# Patient Record
Sex: Female | Born: 1960 | Race: White | Hispanic: No | Smoking: Never smoker
Health system: Southern US, Community
[De-identification: ages and names within clinical notes are randomized; demographics above are authoritative.]

---

## 1998-09-18 ENCOUNTER — Encounter: Admission: RE | Admit: 1998-09-18 | Discharge: 1998-12-17 | Payer: Self-pay | Admitting: Neurological Surgery

## 2000-09-27 ENCOUNTER — Encounter: Payer: Self-pay | Admitting: Neurological Surgery

## 2000-09-27 ENCOUNTER — Ambulatory Visit (HOSPITAL_COMMUNITY): Admission: RE | Admit: 2000-09-27 | Discharge: 2000-09-27 | Payer: Self-pay | Admitting: Neurological Surgery

## 2001-03-03 ENCOUNTER — Encounter: Admission: RE | Admit: 2001-03-03 | Discharge: 2001-03-03 | Payer: Self-pay | Admitting: Obstetrics and Gynecology

## 2001-03-03 ENCOUNTER — Encounter: Payer: Self-pay | Admitting: Obstetrics and Gynecology

## 2001-05-24 ENCOUNTER — Encounter: Admission: RE | Admit: 2001-05-24 | Discharge: 2001-06-16 | Payer: Self-pay | Admitting: Orthopaedic Surgery

## 2001-06-17 ENCOUNTER — Encounter: Admission: RE | Admit: 2001-06-17 | Discharge: 2001-06-30 | Payer: Self-pay | Admitting: Orthopaedic Surgery

## 2001-12-08 ENCOUNTER — Encounter: Admission: RE | Admit: 2001-12-08 | Discharge: 2001-12-08 | Payer: Self-pay | Admitting: Obstetrics and Gynecology

## 2001-12-08 ENCOUNTER — Encounter: Payer: Self-pay | Admitting: Obstetrics and Gynecology

## 2002-06-22 ENCOUNTER — Ambulatory Visit (HOSPITAL_COMMUNITY): Admission: RE | Admit: 2002-06-22 | Discharge: 2002-06-22 | Payer: Self-pay | Admitting: Obstetrics and Gynecology

## 2002-06-22 ENCOUNTER — Encounter: Payer: Self-pay | Admitting: Obstetrics and Gynecology

## 2003-06-27 ENCOUNTER — Encounter: Payer: Self-pay | Admitting: Obstetrics and Gynecology

## 2003-06-27 ENCOUNTER — Ambulatory Visit (HOSPITAL_COMMUNITY): Admission: RE | Admit: 2003-06-27 | Discharge: 2003-06-27 | Payer: Self-pay | Admitting: Obstetrics and Gynecology

## 2003-12-06 ENCOUNTER — Encounter: Admission: RE | Admit: 2003-12-06 | Discharge: 2003-12-06 | Payer: Self-pay | Admitting: Neurological Surgery

## 2004-07-04 ENCOUNTER — Ambulatory Visit (HOSPITAL_COMMUNITY): Admission: RE | Admit: 2004-07-04 | Discharge: 2004-07-04 | Payer: Self-pay | Admitting: Obstetrics and Gynecology

## 2004-08-28 ENCOUNTER — Encounter: Admission: RE | Admit: 2004-08-28 | Discharge: 2004-09-27 | Payer: Self-pay | Admitting: Neurological Surgery

## 2005-07-10 ENCOUNTER — Ambulatory Visit (HOSPITAL_COMMUNITY): Admission: RE | Admit: 2005-07-10 | Discharge: 2005-07-10 | Payer: Self-pay | Admitting: Obstetrics and Gynecology

## 2006-01-26 ENCOUNTER — Ambulatory Visit (HOSPITAL_COMMUNITY): Admission: RE | Admit: 2006-01-26 | Discharge: 2006-01-26 | Payer: Self-pay | Admitting: Neurological Surgery

## 2006-08-13 ENCOUNTER — Ambulatory Visit (HOSPITAL_COMMUNITY): Admission: RE | Admit: 2006-08-13 | Discharge: 2006-08-13 | Payer: Self-pay | Admitting: Obstetrics and Gynecology

## 2007-09-24 ENCOUNTER — Ambulatory Visit (HOSPITAL_COMMUNITY): Admission: RE | Admit: 2007-09-24 | Discharge: 2007-09-24 | Payer: Self-pay | Admitting: Obstetrics and Gynecology

## 2008-09-25 ENCOUNTER — Ambulatory Visit (HOSPITAL_COMMUNITY): Admission: RE | Admit: 2008-09-25 | Discharge: 2008-09-25 | Payer: Self-pay | Admitting: Gynecology

## 2009-09-26 ENCOUNTER — Ambulatory Visit (HOSPITAL_COMMUNITY): Admission: RE | Admit: 2009-09-26 | Discharge: 2009-09-26 | Payer: Self-pay | Admitting: Gynecology

## 2010-09-30 ENCOUNTER — Ambulatory Visit (HOSPITAL_COMMUNITY): Admission: RE | Admit: 2010-09-30 | Discharge: 2010-09-30 | Payer: Self-pay | Admitting: Gynecology

## 2010-12-07 ENCOUNTER — Encounter: Payer: Self-pay | Admitting: Neurological Surgery

## 2012-07-05 ENCOUNTER — Other Ambulatory Visit (HOSPITAL_COMMUNITY): Payer: Self-pay | Admitting: Gynecology

## 2012-07-05 DIAGNOSIS — Z1231 Encounter for screening mammogram for malignant neoplasm of breast: Secondary | ICD-10-CM

## 2012-07-09 ENCOUNTER — Ambulatory Visit (HOSPITAL_COMMUNITY)
Admission: RE | Admit: 2012-07-09 | Discharge: 2012-07-09 | Disposition: A | Discharge status: Home / Self Care | Payer: BC Managed Care – PPO | Source: Ambulatory Visit | Attending: Gynecology | Admitting: Gynecology

## 2012-07-09 DIAGNOSIS — Z1231 Encounter for screening mammogram for malignant neoplasm of breast: Secondary | ICD-10-CM | POA: Insufficient documentation

## 2013-03-30 ENCOUNTER — Other Ambulatory Visit: Payer: Self-pay | Admitting: Obstetrics and Gynecology

## 2013-03-30 DIAGNOSIS — Z780 Asymptomatic menopausal state: Secondary | ICD-10-CM

## 2013-04-07 ENCOUNTER — Ambulatory Visit
Admission: RE | Admit: 2013-04-07 | Discharge: 2013-04-07 | Disposition: A | Discharge status: Home / Self Care | Payer: BC Managed Care – PPO | Source: Ambulatory Visit | Attending: Obstetrics and Gynecology | Admitting: Obstetrics and Gynecology

## 2013-04-07 DIAGNOSIS — Z780 Asymptomatic menopausal state: Secondary | ICD-10-CM

## 2014-09-11 ENCOUNTER — Encounter: Payer: Self-pay | Admitting: Cardiovascular Disease

## 2014-09-11 ENCOUNTER — Ambulatory Visit (INDEPENDENT_AMBULATORY_CARE_PROVIDER_SITE_OTHER): Payer: BC Managed Care – PPO | Admitting: Cardiovascular Disease

## 2014-09-11 VITALS — BP 128/94 | HR 72 | Ht 64.0 in | Wt 154.1 lb

## 2014-09-11 DIAGNOSIS — I471 Supraventricular tachycardia: Secondary | ICD-10-CM

## 2014-09-11 NOTE — Patient Instructions (Addendum)
I think you  have supra-ventricular tachycardia. Tried using the Valsalva maneuver or the diving reflex to low your  heart rate. Try Propranolol 10 mg if the episodes last for longer than 10 minutes.   Come to the ER or our office  if the episodes last for > 1 hour.   Your physician recommends that you continue on your current medications as directed. Please refer to the Current Medication list given to you today.  Your physician has recommended that you wear an event monitor. Event monitors are medical devices that record the heart's electrical activity. Doctors most often us these monitors to diagnose arrhythmias. Arrhythmias are problems with the speed or rhythm of the heartbeat. The monitor is a small, portable device. You can wear one while you do your normal daily activities. This is usually used to diagnose what is causing palpitations/syncope (passing out).  Your physician recommends that you schedule a follow-up appointment in: 4-6 weeks with Dr. Elease HashimotoNahser.

## 2014-09-11 NOTE — Progress Notes (Signed)
     Ariel Hughes Date of Birth  01/18/1961       [facility]    [place] [facility] [address]  [address] [place], [place]  [address]   [place], [place]  [phone]     [phone]   Fax  [phone]     Fax [phone]  Problem List: 1. Palpitations 2.  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  Has had palpitations,  She feels these HR irregularities.   She notes heart racing -   She had an episode recently while swimming.  She got out of the pool and felt fatigues.  Her HR was much faster than she would have anticipated at that time in her work out.  The episode lasted for 5-10 minutes .  She stopped drinking coffee 3 months ago.  Only rare sodas.    Current Outpatient Prescriptions  Medication Sig Dispense Refill  . aspirin EC 81 MG tablet Take 81 mg by mouth daily.       Marland Kitchen. estradiol (MINIVELLE) 0.075 MG/24HR Place 1 patch onto the skin 2 (two) times a week.       . progesterone (PROMETRIUM) 200 MG capsule Take 200 mg by mouth daily. For 12 days       No current facility-administered medications for this visit.     No Known Allergies  No past medical history on file.  No past surgical history on file.  History  Smoking status  . Never Smoker   Smokeless tobacco  . Not on file    History  Alcohol Use: Not on file    No family history on file.  Reviw of Systems:  Reviewed in the HPI.  All other systems are negative.  Physical Exam: Blood pressure 128/94, pulse 72, height 5\' 4"  (1.626 m), weight 154 lb 1.9 oz (69.908 kg). Wt Readings from Last 3 Encounters:  09/11/14 154 lb 1.9 oz (69.908 kg)     General: Well developed, well nourished, in no acute distress.  Head: Normocephalic, atraumatic, sclera non-icteric, mucus membranes are moist,   Neck: Supple. Carotids are 2 + without bruits. No JVD   Lungs: Clear   Heart: RR, normal s1s2  Abdomen: Soft, non-tender,  non-distended with normal bowel sounds.  Msk:  Strength and tone are normal   Extremities: No clubbing or cyanosis. No edema.  Distal pedal pulses are 2+ and equal    Neuro: CN II - XII intact.  Alert and oriented X 3.   Psych:  Normal   ECG: Oct. 26, 2015:  NSR at 72  Assessment / Plan:

## 2014-09-11 NOTE — Assessment & Plan Note (Signed)
Ariel Hughes presents today with symptoms consistent with supraventricular tachycardia. It's possible that she is having brief episodes of atrial fibrillation but I think that the episodes are most likely SVT.  We've reviewed the Valsalva reflex and diving reflex. We discussed different medical therapies for treatment including short-term propranolol and longer-term metoprolol.  We also discussed RF ablation / modification of the AV node for SVT.  We'll place 30 day event monitor on her for further diagnosis. 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 I do not think that she needs an echocardiogram. I'm fairly  sure that she has normal LV function.  If she is found to have something other than SVT, then we will likely need an echo.   I will see her in 4-6 weeks.

## 2014-09-13 ENCOUNTER — Encounter: Payer: Self-pay | Admitting: *Deleted

## 2014-09-13 NOTE — Progress Notes (Signed)
Patient ID: Ariel Hughes, female   DOB: 02/22/1961, 53 y.o.   MRN: [id number] Patient did not show up for 09/13/2014 9:30AM appointment to have a 30 day cardiac event monitor applied.

## 2014-09-18 ENCOUNTER — Other Ambulatory Visit: Payer: Self-pay | Admitting: Nurse Practitioner

## 2014-09-18 ENCOUNTER — Encounter (INDEPENDENT_AMBULATORY_CARE_PROVIDER_SITE_OTHER): Payer: BC Managed Care – PPO

## 2014-09-18 ENCOUNTER — Encounter: Payer: Self-pay | Admitting: *Deleted

## 2014-09-18 DIAGNOSIS — I471 Supraventricular tachycardia: Secondary | ICD-10-CM

## 2014-09-18 DIAGNOSIS — R002 Palpitations: Secondary | ICD-10-CM

## 2014-09-18 NOTE — Progress Notes (Signed)
Patient ID: Ariel Hughes, female   DOB: 08/15/1961, 53 y.o.   MRN: [id number] [employer] verite 30 day cardiac event monitor applied to patient.

## 2014-10-24 ENCOUNTER — Encounter: Payer: Self-pay | Admitting: Cardiovascular Disease

## 2014-10-24 ENCOUNTER — Ambulatory Visit (INDEPENDENT_AMBULATORY_CARE_PROVIDER_SITE_OTHER): Payer: BC Managed Care – PPO | Admitting: Cardiovascular Disease

## 2014-10-24 VITALS — BP 136/92 | HR 65 | Ht 64.0 in | Wt 154.0 lb

## 2014-10-24 DIAGNOSIS — I471 Supraventricular tachycardia: Secondary | ICD-10-CM

## 2014-10-24 NOTE — Assessment & Plan Note (Signed)
Ariel Hughes is seen back today for evaluation of her tachycardia. She had another puncture that showed only episodes of sinus tachycardia she never had any episodes of SVT. She has been instructed in Valsalva maneuver and the diving reflex.  At this point we'll see her on an as-needed basis. She has propranolol to take on an as-needed basis.

## 2014-10-24 NOTE — Progress Notes (Signed)
     Ariel Hughes Date of Birth  12/31/1960       [facility]    [facility] [place] [facility] [address]  [address] [place], [place]  [address]   [place], [place]  [address] [phone]     [phone]   Fax  [phone]     Fax [phone]  Problem List: 1. Palpitations 2.  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  Has had palpitations,  She feels these HR irregularities.   She notes heart racing -   She had an episode recently while swimming.  She got out of the pool and felt fatigues.  Her HR was much faster than she would have anticipated at that time in her work out.  The episode lasted for 5-10 minutes .  She stopped drinking coffee 3 months ago.  Only rare sodas.    Dec. 8, 2015  Ariel Hughes is doing ok Event monitor showed sinus tachy . Palpitations seem to occur more at night.   Current Outpatient Prescriptions  Medication Sig Dispense Refill  . aspirin EC 81 MG tablet Take 81 mg by mouth daily.     Marland Kitchen. estradiol (MINIVELLE) 0.075 MG/24HR Place 1 patch onto the skin 2 (two) times a week.     . progesterone (PROMETRIUM) 200 MG capsule Take 200 mg by mouth daily. For 12 days     No current facility-administered medications for this visit.     No Known Allergies  No past medical history on file.  No past surgical history on file.  History  Smoking status  . Never Smoker   Smokeless tobacco  . Not on file    History  Alcohol Use: Not on file    No family history on file.  Reviw of Systems:  Reviewed in the HPI.  All other systems are negative.  Physical Exam: Blood pressure 136/92, pulse 65, height 5\' 4"  (1.626 m), weight 154 lb (69.854 kg). Wt Readings from Last 3 Encounters:  10/24/14 154 lb (69.854 kg)  09/11/14 154 lb 1.9 oz (69.908 kg)     General: Well developed, well nourished, in no acute distress.  Head: Normocephalic, atraumatic, sclera non-icteric, mucus membranes are  moist,   Neck: Supple. Carotids are 2 + without bruits. No JVD   Lungs: Clear   Heart: RR, normal s1s2  Abdomen: Soft, non-tender, non-distended with normal bowel sounds.  Msk:  Strength and tone are normal   Extremities: No clubbing or cyanosis. No edema.  Distal pedal pulses are 2+ and equal    Neuro: CN II - XII intact.  Alert and oriented X 3.   Psych:  Normal   ECG: Oct. 26, 2015:  NSR at 72  Assessment / Plan:

## 2014-10-24 NOTE — Patient Instructions (Signed)
Your physician recommends that you continue on your current medications as directed. Please refer to the Current Medication list given to you today.  Your physician recommends that you schedule a follow-up appointment in: as needed with Dr. Nahser  

## 2019-12-29 ENCOUNTER — Ambulatory Visit: Payer: Self-pay | Attending: Internal Medicine

## 2019-12-29 ENCOUNTER — Ambulatory Visit: Payer: Self-pay

## 2019-12-29 DIAGNOSIS — Z23 Encounter for immunization: Secondary | ICD-10-CM | POA: Insufficient documentation

## 2019-12-29 NOTE — Progress Notes (Signed)
   Covid-19 Vaccination Clinic  Name:  Ariel Hughes    MRN: [id number] DOB: 12-02-1960  12/29/2019  Ariel Hughes was observed post Covid-19 immunization for 15 minutes without incidence. She was provided with Vaccine Information Sheet and instruction to access the V-Safe system.   Ariel Hughes was instructed to call 911 with any severe reactions post vaccine: Marland Kitchen Difficulty breathing  . Swelling of your face and throat  . A fast heartbeat  . A bad rash all over your body  . Dizziness and weakness    Immunizations Administered    Name Date Dose VIS Date Route   Pfizer COVID-19 Vaccine 12/29/2019 10:49 AM 0.3 mL 10/28/2019 Intramuscular   Manufacturer: [employer], [employer]   Lot: [id number]   NDC: [address]-1

## 2020-01-23 ENCOUNTER — Ambulatory Visit: Payer: Self-pay | Attending: Internal Medicine

## 2020-01-23 DIAGNOSIS — Z23 Encounter for immunization: Secondary | ICD-10-CM

## 2020-01-23 NOTE — Progress Notes (Signed)
   Covid-19 Vaccination Clinic  Name:  Ariel Hughes    MRN: [id number] DOB: Oct 01, 1961  01/23/2020  Ariel Hughes was observed post Covid-19 immunization for 15 minutes without incident. She was provided with Vaccine Information Sheet and instruction to access the V-Safe system.   Ariel Hughes was instructed to call 911 with any severe reactions post vaccine: Marland Kitchen Difficulty breathing  . Swelling of face and throat  . A fast heartbeat  . A bad rash all over body  . Dizziness and weakness   Immunizations Administered    Name Date Dose VIS Date Route   Pfizer COVID-19 Vaccine 01/23/2020  8:25 AM 0.3 mL 10/28/2019 Intramuscular   Manufacturer: [employer], [employer]   Lot: [id number]   NDC: [address]-1

## 2020-08-23 ENCOUNTER — Other Ambulatory Visit: Payer: Self-pay | Admitting: Family Medicine

## 2020-08-23 DIAGNOSIS — M4722 Other spondylosis with radiculopathy, cervical region: Secondary | ICD-10-CM

## 2020-09-14 ENCOUNTER — Other Ambulatory Visit: Payer: Self-pay

## 2020-09-20 ENCOUNTER — Ambulatory Visit
Admission: RE | Admit: 2020-09-20 | Discharge: 2020-09-20 | Disposition: A | Discharge status: Home / Self Care | Payer: BC Managed Care – PPO | Source: Ambulatory Visit | Attending: Family Medicine | Admitting: Family Medicine

## 2020-09-20 ENCOUNTER — Other Ambulatory Visit: Payer: Self-pay

## 2020-09-20 DIAGNOSIS — M4722 Other spondylosis with radiculopathy, cervical region: Secondary | ICD-10-CM

## 2021-03-15 IMAGING — MR MR CERVICAL SPINE W/O CM
4 of 5 series · 28 of 48 positions shown · non-contrast
Comparison: MRI of the cervical spine December 06, 2003

CLINICAL DATA: Cervical spondylosis with radiculopathy.

EXAM:
MRI CERVICAL SPINE WITHOUT CONTRAST
TECHNIQUE: Multiplanar, multisequence MR imaging of the cervical spine was
performed. No intravenous contrast was administered.

[Series 3: T2 · sagittal · 3.0mm · 0.66mm/px · 6 of 14 slices shown (1 of 2)]
[im 1/14]
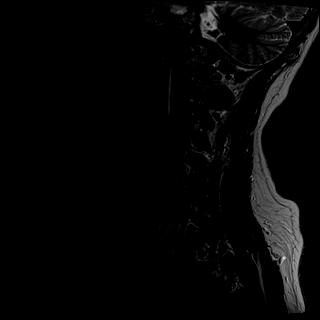
[im 3/14]
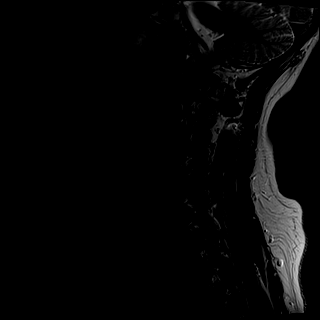
[im 6/14]
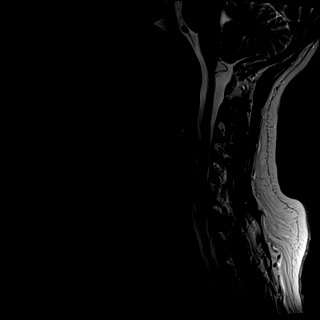
[im 8/14]
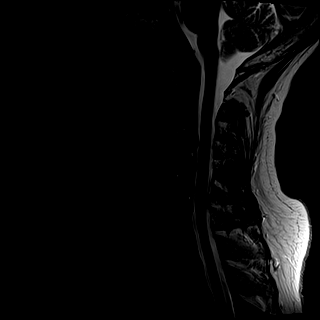
[im 11/14]
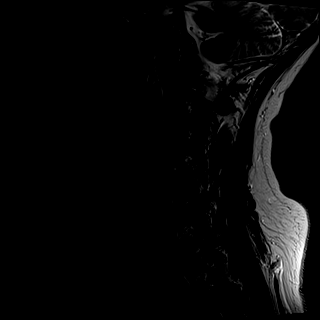
[im 14/14]
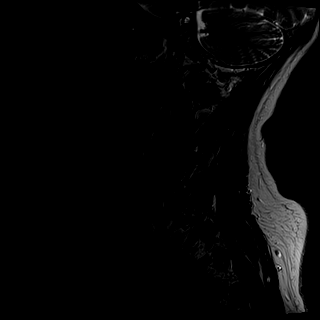

[Series 4: T1 · sagittal · 3.0mm · 0.41mm/px · 7 of 14 slices shown]
[im 1/14]
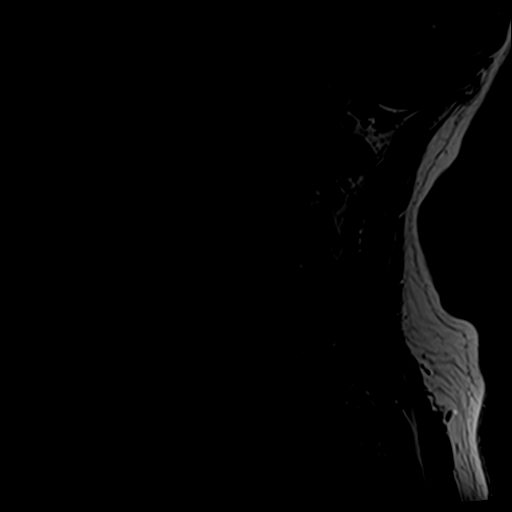
[im 3/14]
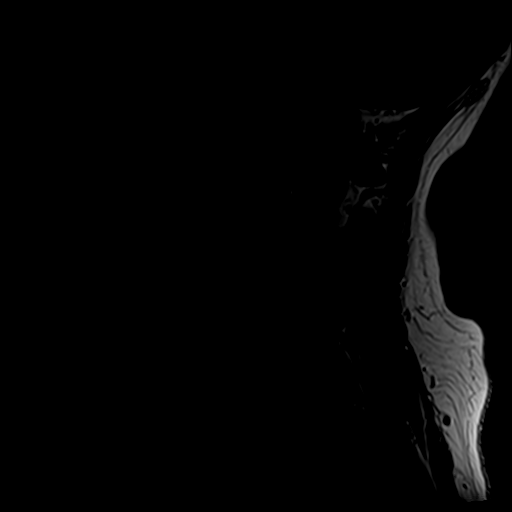
[im 5/14]
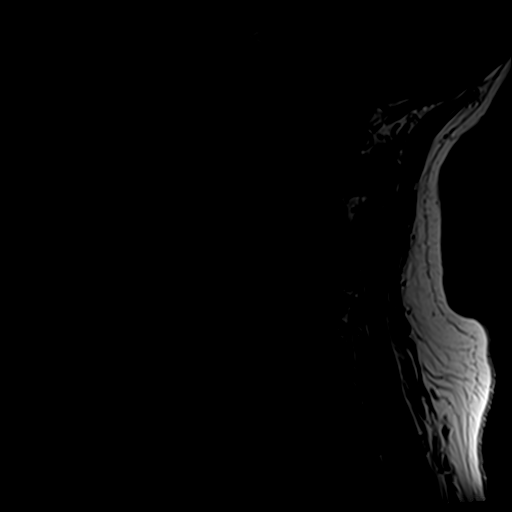
[im 7/14]
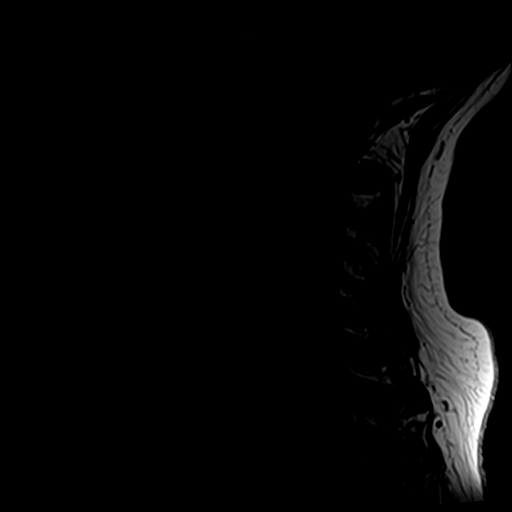
[im 9/14]
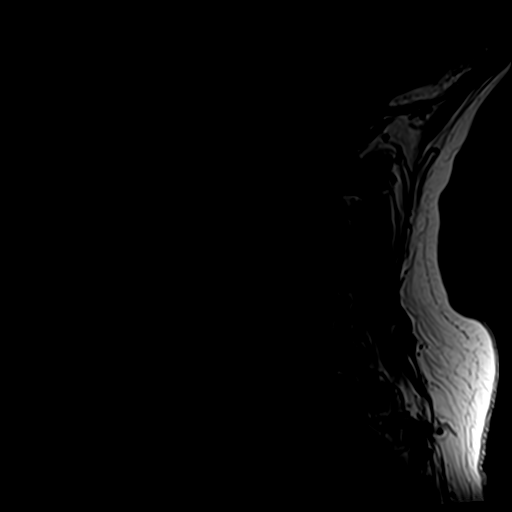
[im 11/14]
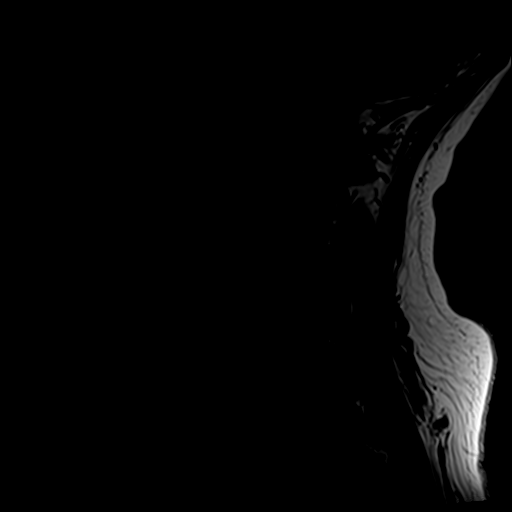
[im 14/14]
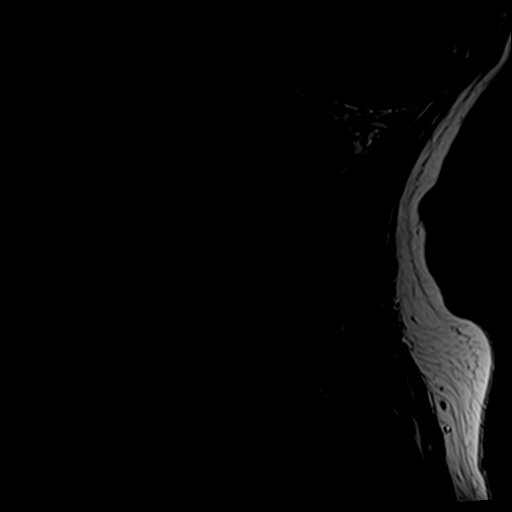

[Series 5: tir sag · sagittal · 3.0mm · 0.41mm/px · 7 of 14 slices shown]
[im 1/14]
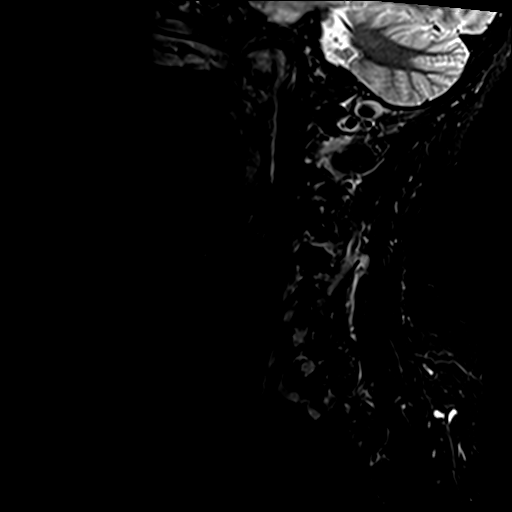
[im 3/14]
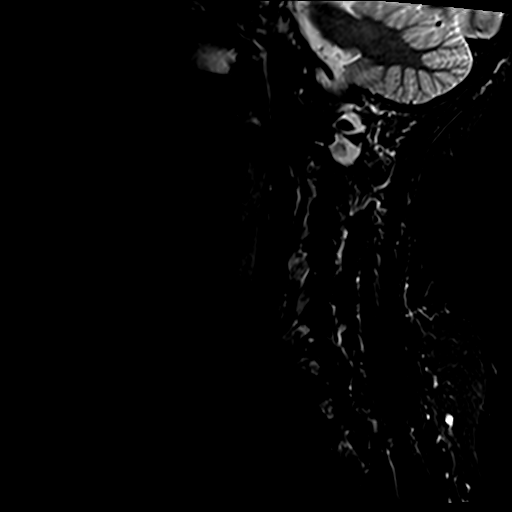
[im 5/14]
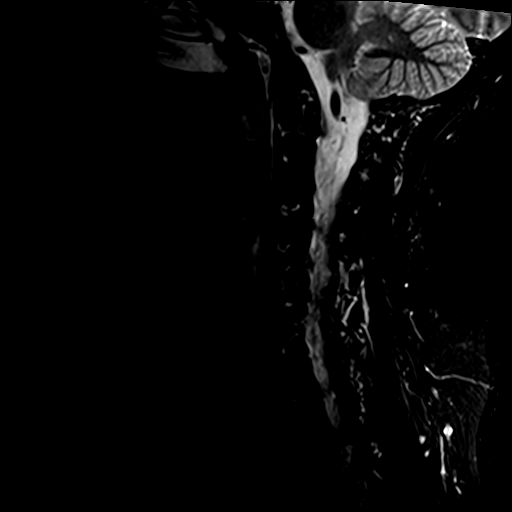
[im 7/14]
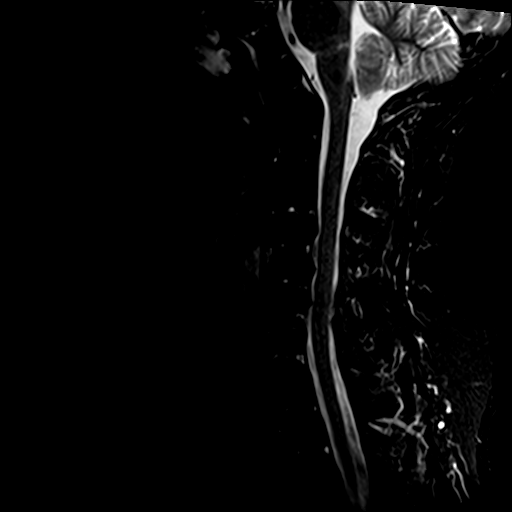
[im 9/14]
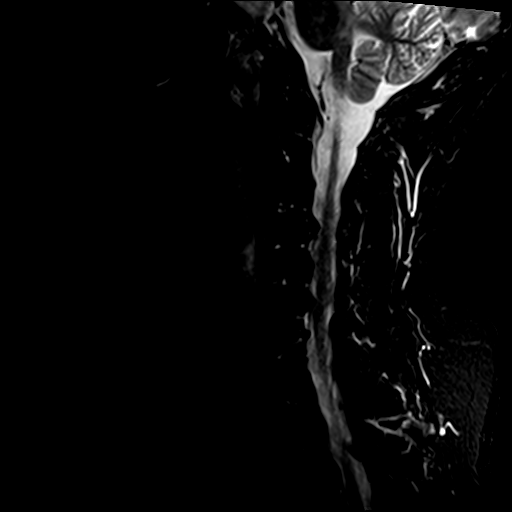
[im 11/14]
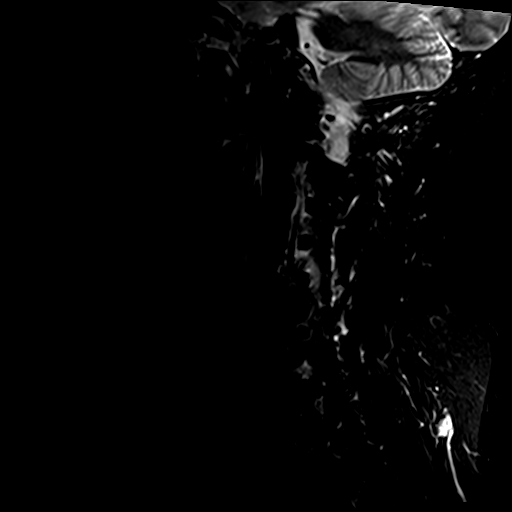
[im 14/14]
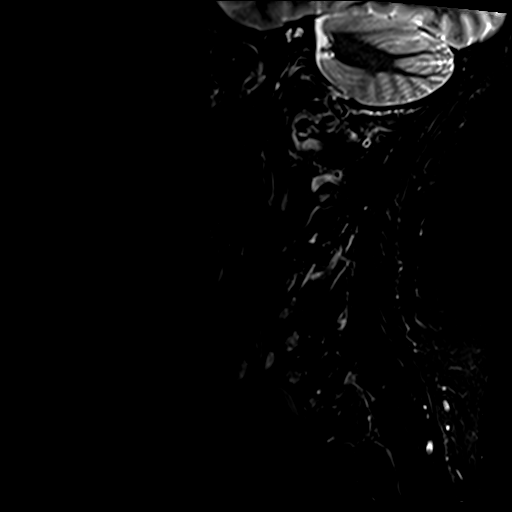

[Series 7: T2 · axial · 3.0mm · 0.70mm/px · z∈[-43,+55]mm · 8 of 28 slices shown (2 of 2)]
[im 1/28]
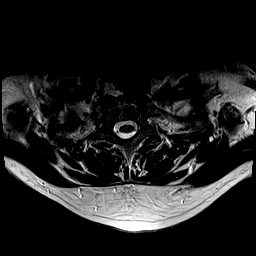
[im 5/28]
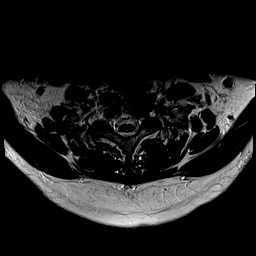
[im 9/28]
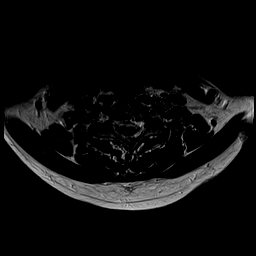
[im 13/28]
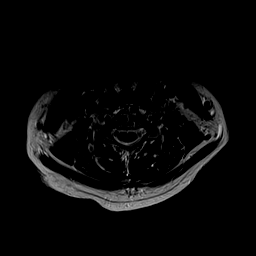
[im 15/28]
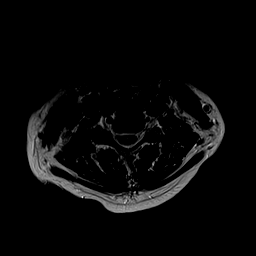
[im 19/28]
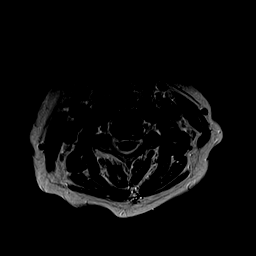
[im 23/28]
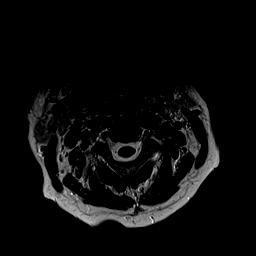
[im 28/28]
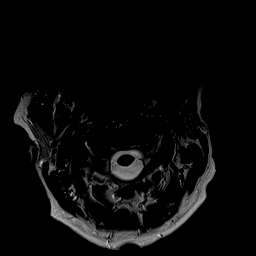

[28 of 48 positions shown; findings below may reference images not displayed]

FINDINGS: Alignment: Straightening of the cervical curvature.

Vertebrae: No fracture, evidence of discitis, or bone lesion.

Cord: Normal signal and morphology.

Posterior Fossa, vertebral arteries, paraspinal tissues: Negative.

Disc levels:

C2-3: No spinal canal or neural foraminal stenosis.

C3-4: Posterior disc protrusion without significant spinal canal
stenosis. Uncovertebral and facet degenerative changes resulting in
mild bilateral neural foraminal narrowing.

C4-5: Small posterior disc protrusion without significant spinal
canal stenosis. Uncovertebral and facet degenerative changes
resulting in mild right neural foraminal narrowing.

C5-6: Loss of disc height, posterior disc protrusion without
significant spinal canal stenosis. Uncovertebral and facet
degenerative changes resulting in moderate right and severe left
neural foraminal narrowing.

C6-7: Posterior disc protrusion without significant spinal canal
stenosis. Uncovertebral and facet degenerative changes resulting in
moderate to severe left neural foraminal narrowing.

C7-T1: No spinal canal or neural foraminal stenosis.

Compared to prior study there has been progression of the
degenerative disc disease throughout the cervical spine with
progression of neural foraminal stenosis.
IMPRESSION: 1. Multilevel degenerative changes of the cervical spine as
described above, progressed from prior study.
2. No significant spinal canal stenosis at any level.
3. Moderate right and severe left neural foraminal narrowing at
C5-6.
4. Moderate to severe left neural foraminal narrowing at C6-7.
